# Patient Record
Sex: Male | Born: 1970 | Race: Black or African American | Hispanic: No | Marital: Married
Health system: Southern US, Community
[De-identification: ages and names within clinical notes are randomized; demographics above are authoritative.]

---

## 2018-08-21 ENCOUNTER — Other Ambulatory Visit: Payer: Self-pay | Admitting: Orthopedic Surgery

## 2018-08-21 DIAGNOSIS — M19031 Primary osteoarthritis, right wrist: Secondary | ICD-10-CM

## 2018-08-22 ENCOUNTER — Encounter: Payer: Self-pay | Admitting: Neurology

## 2018-08-22 ENCOUNTER — Other Ambulatory Visit: Payer: Self-pay

## 2018-08-22 DIAGNOSIS — R200 Anesthesia of skin: Secondary | ICD-10-CM

## 2018-08-29 ENCOUNTER — Other Ambulatory Visit: Payer: Self-pay | Admitting: Orthopedic Surgery

## 2018-09-05 ENCOUNTER — Other Ambulatory Visit: Payer: Self-pay

## 2018-09-05 ENCOUNTER — Ambulatory Visit
Admission: RE | Admit: 2018-09-05 | Discharge: 2018-09-05 | Disposition: A | Discharge status: Home / Self Care | Payer: BC Managed Care – PPO | Source: Ambulatory Visit | Attending: Orthopedic Surgery | Admitting: Orthopedic Surgery

## 2018-09-05 DIAGNOSIS — M19031 Primary osteoarthritis, right wrist: Secondary | ICD-10-CM

## 2018-09-27 ENCOUNTER — Other Ambulatory Visit: Payer: Self-pay

## 2018-09-27 ENCOUNTER — Ambulatory Visit (INDEPENDENT_AMBULATORY_CARE_PROVIDER_SITE_OTHER): Payer: BC Managed Care – PPO | Admitting: Neurology

## 2018-09-27 DIAGNOSIS — G5603 Carpal tunnel syndrome, bilateral upper limbs: Secondary | ICD-10-CM

## 2018-09-27 DIAGNOSIS — G5623 Lesion of ulnar nerve, bilateral upper limbs: Secondary | ICD-10-CM

## 2018-09-27 DIAGNOSIS — R200 Anesthesia of skin: Secondary | ICD-10-CM

## 2018-09-27 NOTE — Procedures (Signed)
[facility]  [address], [address]  [place], [place] [address] Tel: [phone] Fax:  [phone] Test Date:  09/27/2018  Patient: Shane FriskJames Ross DOB: 1970-08-19 Physician: Nita Sickleonika Patel, DO  Sex: Male Height: 6\' 0"  Ref Phys: Cindee SaltGary Kuzma, MD  ID#: [id number] Temp: 33.0C Technician:    Patient Complaints: This is a 48 year old man referred for evaluation of right hand numbness.  NCV & EMG Findings: Extensive electrodiagnostic testing of the right upper extremity and additional studies of the left shows:  1. Bilateral median and ulnar sensory responses show prolonged latencies (R3.8, L3.9, R3.2, L3.9 ms); additionally, the left median sensory response shows reduced amplitude (17.0 V).   2. Right median motor response shows mildly prolonged latency (4.1 ms).  Left median motor responses within normal limits.  Bilateral ulnar motor responses show slowed conduction velocity across the elbow (A Elbow-B Elbow, R45, L38 m/s).   3. There is no evidence of active or chronic motor axonal loss changes affecting any of the tested muscles.  Motor unit configuration and recruitment pattern is within normal limits.    Impression: 1. 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 2. Bilateral ulnar neuropathy with slowing across the elbow, purely demyelinating, mild-to-moderate.   ___________________________ Nita Sickleonika Patel, DO    Nerve Conduction Studies Anti Sensory Summary Table   Site NR Peak (ms) Norm Peak (ms) P-T Amp (V) Norm P-T Amp  Left Median Anti Sensory (2nd Digit)  33C  Wrist    3.9 <3.4 17.0 >20  Right Median Anti Sensory (2nd Digit)  33C  Wrist    3.8 <3.4 23.2 >20  Left Ulnar Anti Sensory (5th Digit)  33C  Wrist    3.9 <3.1 17.1 >12  Right Ulnar Anti Sensory (5th Digit)  33C  Wrist    3.2 <3.1 16.7 >12   Motor Summary Table   Site NR Onset (ms) Norm Onset (ms) O-P Amp (mV)  Norm O-P Amp Site1 Site2 Delta-0 (ms) Dist (cm) Vel (m/s) Norm Vel (m/s)  Left Median Motor (Abd Poll Brev)  33C  Wrist    3.7 <3.9 11.2 >6 Elbow Wrist 6.5 33.0 51 >50  Elbow    10.2  10.8         Right Median Motor (Abd Poll Brev)  33C  Wrist    4.1 <3.9 7.5 >6 Elbow Wrist 5.7 32.0 56 >50  Elbow    9.8  6.5         Left Ulnar Motor (Abd Dig Minimi)  33C  Wrist    2.9 <3.1 10.0 >7 B Elbow Wrist 4.7 26.0 55 >50  B Elbow    7.6  8.6  A Elbow B Elbow 2.6 10.0 38 >50  A Elbow    10.2  8.0         Right Ulnar Motor (Abd Dig Minimi)  33C  Wrist    2.7 <3.1 10.5 >7 B Elbow Wrist 4.6 25.0 54 >50  B Elbow    7.3  9.9  A Elbow B Elbow 2.2 10.0 45 >50  A Elbow    9.5  9.5          EMG   Side Muscle Ins Act Fibs Psw Fasc Number Recrt Dur Dur. Amp Amp. Poly Poly. Comment  Right 1stDorInt Nml Nml Nml Nml Nml Nml Nml Nml Nml Nml Nml Nml N/A  Right PronatorTeres Nml Nml Nml Nml Nml Nml Nml  Nml Nml Nml Nml Nml N/A  Right Abd Poll Brev Nml Nml Nml Nml Nml Nml Nml Nml Nml Nml Nml Nml N/A  Right Biceps Nml Nml Nml Nml Nml Nml Nml Nml Nml Nml Nml Nml N/A  Right Triceps Nml Nml Nml Nml Nml Nml Nml Nml Nml Nml Nml Nml N/A  Right Deltoid Nml Nml Nml Nml Nml Nml Nml Nml Nml Nml Nml Nml N/A  Left 1stDorInt Nml Nml Nml Nml Nml Nml Nml Nml Nml Nml Nml Nml N/A  Left Abd Poll Brev Nml Nml Nml Nml Nml Nml Nml Nml Nml Nml Nml Nml N/A  Left ABD Dig Min Nml Nml Nml Nml Nml Nml Nml Nml Nml Nml Nml Nml N/A  Left FlexCarpiUln Nml Nml Nml Nml Nml Nml Nml Nml Nml Nml Nml Nml N/A  Left PronatorTeres Nml Nml Nml Nml Nml Nml Nml Nml Nml Nml Nml Nml N/A      Waveforms:

## 2020-09-12 IMAGING — CT CT OF THE RIGHT WRIST WITHOUT CONTRAST
1 series · 12 of 14 positions shown, 15 images · non-contrast
Comparison: None.

CLINICAL DATA: Right wrist pain and swelling for 1 year. Limited
range of motion. Numbness, tingling, and weakness in the right hand.
Prior carpal tunnel surgery. Arthritis of the right hand.

EXAM:
CT OF THE RIGHT WRIST WITHOUT CONTRAST
TECHNIQUE: Multidetector CT imaging of the right wrist was performed according
to the standard protocol. Multiplanar CT image reconstructions were
also generated.

[Series 3: ext-soft · axial · 0.29mm/px · z∈[+60,+166]mm · 12 of 63 slices shown, 15 images]
[im 5/63  soft-tissue]
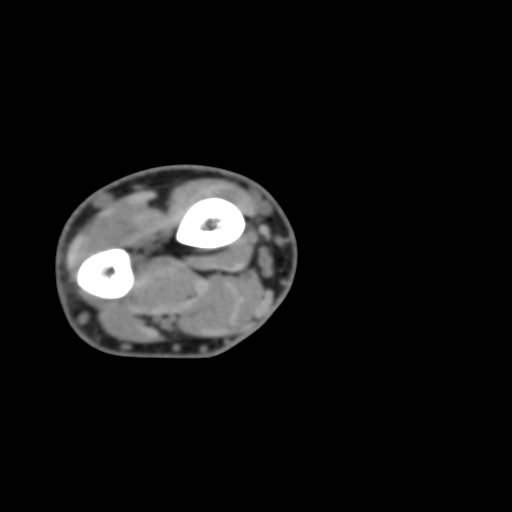
[im 5/63  bone]
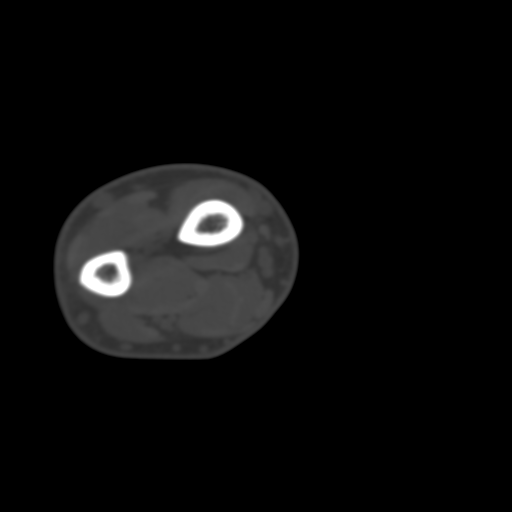
[im 10/63  bone]
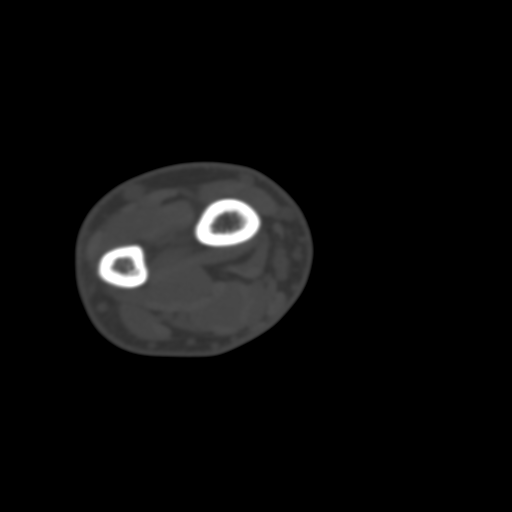
[im 15/63  bone]
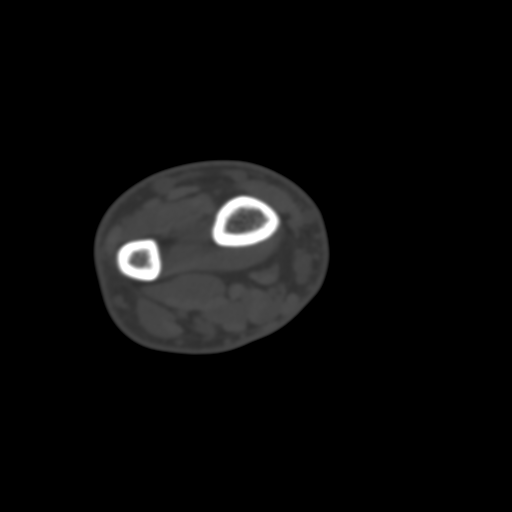
[im 20/63  bone]
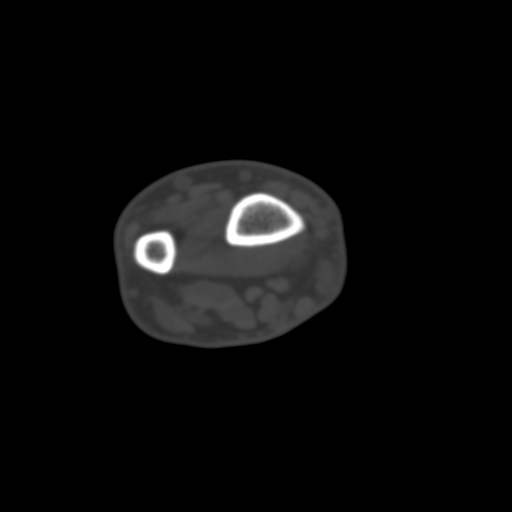
[im 24/63  soft-tissue]
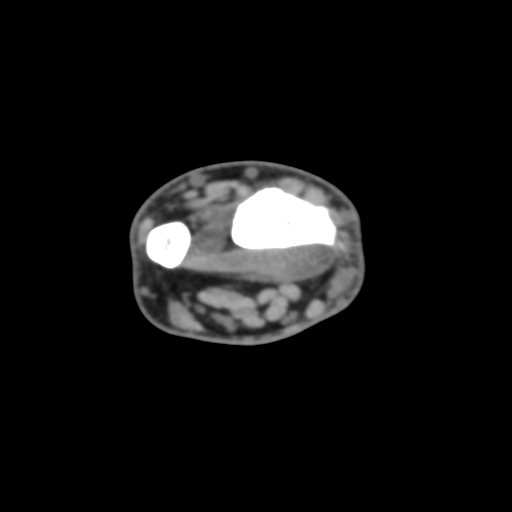
[im 24/63  bone]
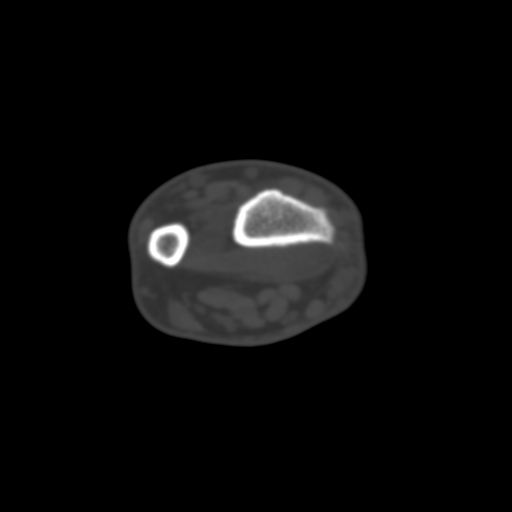
[im 29/63  bone]
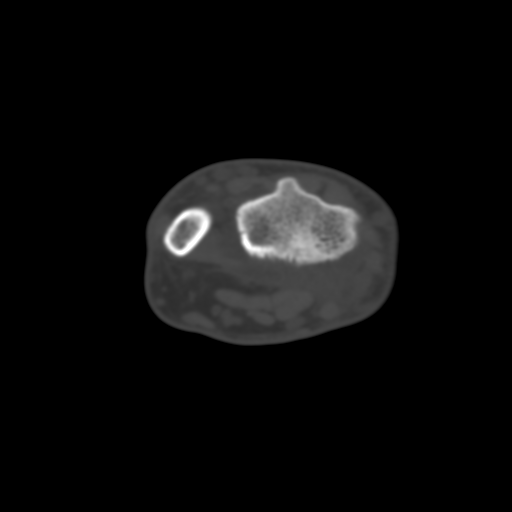
[im 34/63  bone]
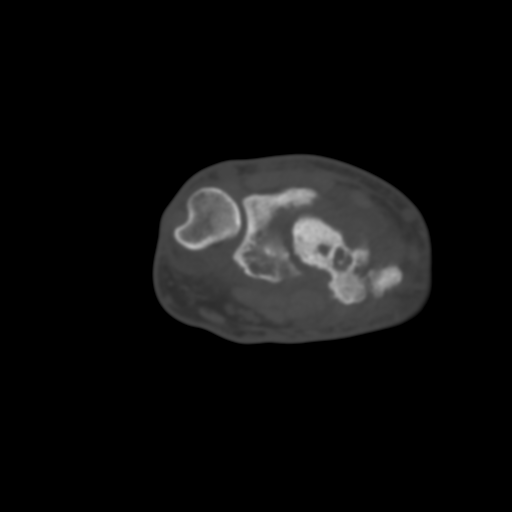
[im 39/63  bone]
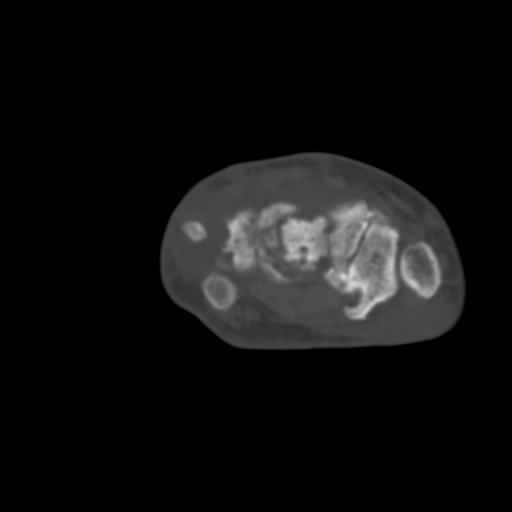
[im 43/63  soft-tissue]
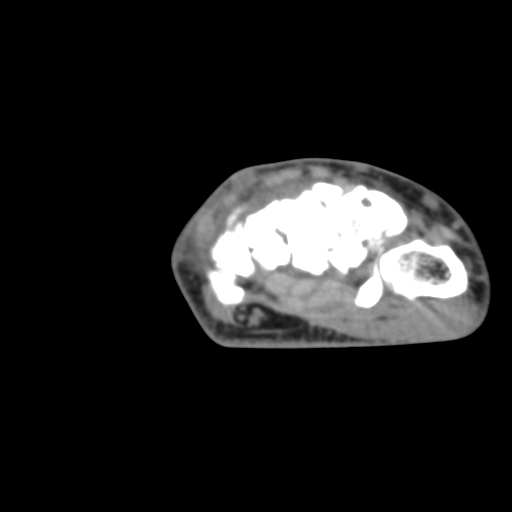
[im 43/63  bone]
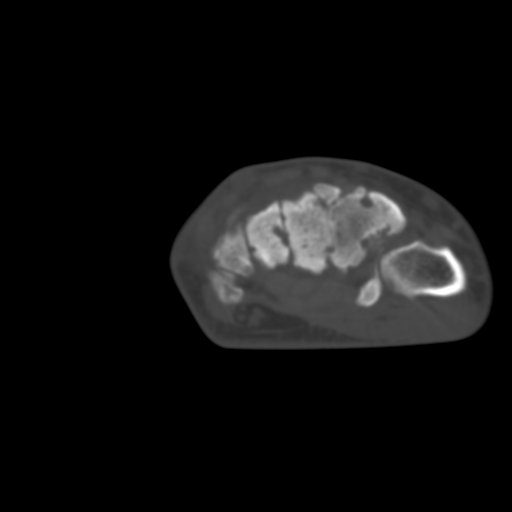
[im 48/63  bone]
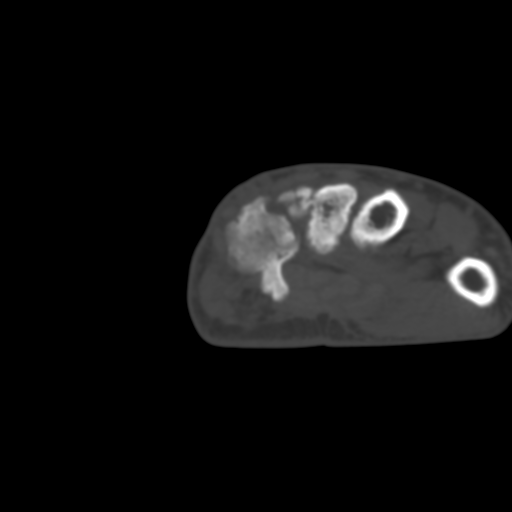
[im 53/63  bone]
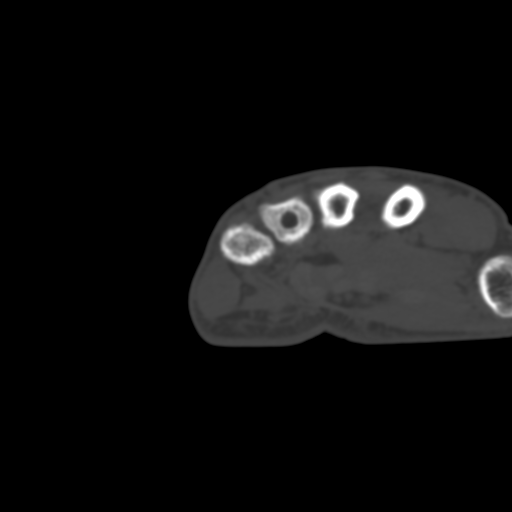
[im 58/63  bone]
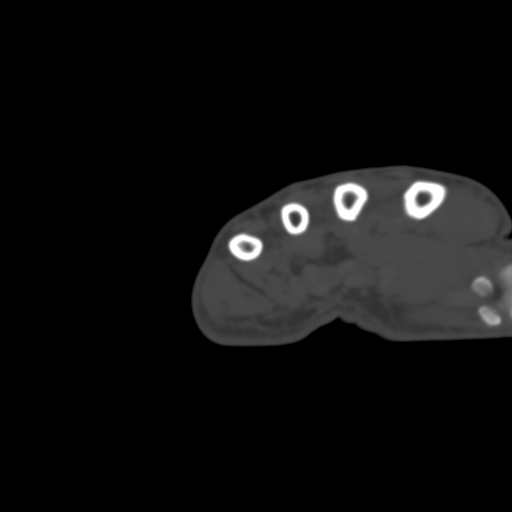

[12 of 14 positions shown; findings below may reference images not displayed]

FINDINGS: Bones/Joint/Cartilage

There are severe arthritic changes of the radiocarpal joint,
midcarpal joint, and of the carpal metacarpal joints. There are
numerous erosions of the bones. There is marked joint space
narrowing throughout that region. There is ankylosis of the
trapezoid with the base of the second metacarpal. There is no
erosion of the ulnar styloid. Distal radioulnar joint is normal. No
significant abnormality of the first CMC joint. There are effusions
in the radiocarpal joint and midcarpal joint.

Muscles and Tendons

Normal. Prior carpal tunnel release. No compression of the median
nerve.

Soft tissues

Normal.
IMPRESSION: Severe inflammatory arthritic changes of the radiocarpal joint,
midcarpal joint and carpal metacarpal joints as described. This most
likely represents gout.
